# Patient Record
Sex: Female | Born: 1980 | Race: White | Hispanic: No | Marital: Single | Smoking: Never smoker
Health system: Southern US, Community
[De-identification: ages and names within clinical notes are randomized; demographics above are authoritative.]

## PROBLEM LIST (undated history)

## (undated) DIAGNOSIS — M549 Dorsalgia, unspecified: Secondary | ICD-10-CM

## (undated) HISTORY — DX: Dorsalgia, unspecified: M54.9

---
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
## 2002-04-04 ENCOUNTER — Other Ambulatory Visit: Admission: RE | Admit: 2002-04-04 | Discharge: 2002-04-04 | Payer: Self-pay | Admitting: Obstetrics & Gynecology

## 2002-07-01 ENCOUNTER — Emergency Department (HOSPITAL_COMMUNITY): Admission: EM | Admit: 2002-07-01 | Discharge: 2002-07-01 | Payer: Self-pay | Admitting: Emergency Medicine

## 2002-11-11 ENCOUNTER — Inpatient Hospital Stay (HOSPITAL_COMMUNITY): Admission: AD | Admit: 2002-11-11 | Discharge: 2002-11-11 | Payer: Self-pay | Admitting: Obstetrics & Gynecology

## 2002-11-12 ENCOUNTER — Inpatient Hospital Stay (HOSPITAL_COMMUNITY): Admission: AD | Admit: 2002-11-12 | Discharge: 2002-11-13 | Payer: Self-pay | Admitting: Obstetrics & Gynecology

## 2003-01-29 ENCOUNTER — Emergency Department (HOSPITAL_COMMUNITY): Admission: EM | Admit: 2003-01-29 | Discharge: 2003-01-29 | Payer: Self-pay | Admitting: Emergency Medicine

## 2003-08-15 ENCOUNTER — Other Ambulatory Visit: Admission: RE | Admit: 2003-08-15 | Discharge: 2003-08-15 | Payer: Self-pay | Admitting: Obstetrics and Gynecology

## 2003-11-03 ENCOUNTER — Emergency Department (HOSPITAL_COMMUNITY): Admission: EM | Admit: 2003-11-03 | Discharge: 2003-11-03 | Payer: Self-pay | Admitting: Family Medicine

## 2004-08-17 ENCOUNTER — Other Ambulatory Visit: Admission: RE | Admit: 2004-08-17 | Discharge: 2004-08-17 | Payer: Self-pay | Admitting: Obstetrics and Gynecology

## 2013-07-17 ENCOUNTER — Other Ambulatory Visit: Payer: Self-pay | Admitting: Obstetrics and Gynecology

## 2013-07-17 DIAGNOSIS — N63 Unspecified lump in unspecified breast: Secondary | ICD-10-CM

## 2013-07-25 ENCOUNTER — Inpatient Hospital Stay: Admission: RE | Admit: 2013-07-25 | Payer: Self-pay | Source: Ambulatory Visit

## 2013-07-25 ENCOUNTER — Other Ambulatory Visit: Payer: Self-pay

## 2013-08-08 ENCOUNTER — Other Ambulatory Visit: Payer: Self-pay

## 2013-08-14 ENCOUNTER — Other Ambulatory Visit: Payer: Self-pay

## 2015-05-09 ENCOUNTER — Ambulatory Visit (INDEPENDENT_AMBULATORY_CARE_PROVIDER_SITE_OTHER): Payer: BLUE CROSS/BLUE SHIELD | Admitting: Family Medicine

## 2015-05-09 VITALS — BP 108/68 | HR 81 | Temp 98.0°F | Resp 20 | Ht 68.0 in | Wt 221.6 lb

## 2015-05-09 DIAGNOSIS — J069 Acute upper respiratory infection, unspecified: Secondary | ICD-10-CM

## 2015-05-09 NOTE — Progress Notes (Signed)
Subjective:  By signing my name below, I, Ashlee Ware, attest that this documentation has been prepared under the direction and in the presence of [name], MD.  Electronically Signed: Andrew Au, ED Scribe. 05/09/2015. 1:05 PM.   Patient ID: Ashlee Ware, female    DOB: 09/19/1980, 35 y.o.   MRN: [id number]  HPI Chief Complaint  Patient presents with  . Cough    x 5 days  . Sinus Problem  . Sore Throat  . Chest Pain   HPI Comments: Ashlee Ware is a 35 y.o. female who presents to the [facility] complaining of cough for 5 days. Pt states symptoms started with fever of 101, 4 days and cough with a bad taste in her mouth. She reports fevers subsided but developed worsening cough, chest soreness due to cough, body aches, HA, loose stools. Sore throat, ear pressure and watery eyes yesterday. She has taken tylenol cold and flu and mucinex with minimal relief. She denies nausea and emesis.   There are no active problems to display for this patient.  History reviewed. No pertinent past medical history. History reviewed. No pertinent past surgical history. No Known Allergies Prior to Admission medications   Not on File   Social History   Social History  . Marital Status: Single    Spouse Name: N/A  . Number of Children: N/A  . Years of Education: N/A   Occupational History  . Not on file.   Social History Main Topics  . Smoking status: Never Smoker   . Smokeless tobacco: Not on file  . Alcohol Use: No  . Drug Use: No  . Sexual Activity: Not on file   Other Topics Concern  . Not on file   Social History Narrative  . No narrative on file   Review of Systems  Constitutional: Positive for fever and chills.  HENT: Positive for congestion, sinus pressure and sore throat.   Eyes: Positive for discharge ( watery).  Respiratory: Positive for cough.   Gastrointestinal: Positive for diarrhea. Negative for nausea, vomiting and abdominal pain.    Musculoskeletal: Positive for myalgias.  Neurological: Positive for headaches.   Objective:   Physical Exam  Constitutional: She is oriented to person, place, and time. She appears well-developed and well-nourished. No distress.  HENT:  Head: Normocephalic and atraumatic.  Nose: Right sinus exhibits no maxillary sinus tenderness and no frontal sinus tenderness. Left sinus exhibits no maxillary sinus tenderness and no frontal sinus tenderness.  Mouth/Throat: Oropharynx is clear and moist. No posterior oropharyngeal erythema.  Eyes: Conjunctivae and EOM are normal.  Neck: Neck supple.  Cardiovascular: Normal rate.   Pulmonary/Chest: Effort normal.  Musculoskeletal: Normal range of motion.  Lymphadenopathy:    She has no cervical adenopathy.  Neurological: She is alert and oriented to person, place, and time.  Skin: Skin is warm and dry.  Psychiatric: She has a normal mood and affect. Her behavior is normal.  Nursing note and vitals reviewed.  Filed Vitals:   05/09/15 1224  BP: 108/68  Pulse: 81  Temp: 98 F (36.7 C)  TempSrc: Oral  Resp: 20  Height:  (1.727 m)  Weight: 221 lb 9.6 oz (100.517 kg)  SpO2: 98%   Assessment & Plan:  [name] is a 35 y.o. female Acute upper respiratory infection  - Suspected viral syndrome earlier this week, and by initial description may have been influenza. She has now improved and afebrile now.  -Symptomatic  care discussed, as in AVS. RTC precautions discussed.   No orders of the defined types were placed in this encounter.   Patient Instructions  I suspect you had the flu or another virus, that appears to be improving. If fevers return, or you are worsening, recommend recheck. For now can use salt water saline nasal spray over-the-counter for congestion, Sudafed if needed for pressure or congestion, Mucinex or Mucinex DM as needed for cough, and drink plenty of fluids throughout the day. Rest as needed.   Return to the  clinic or go to the nearest emergency room if any of your symptoms worsen or new symptoms occur.  Upper Respiratory Infection, Adult Most upper respiratory infections (URIs) are a viral infection of the air passages leading to the lungs. A URI affects the nose, throat, and upper air passages. The most common type of URI is nasopharyngitis and is typically referred to as "the common cold." URIs run their course and usually go away on their own. Most of the time, a URI does not require medical attention, but sometimes a bacterial infection in the upper airways can follow a viral infection. This is called a secondary infection. Sinus and middle ear infections are common types of secondary upper respiratory infections. Bacterial pneumonia can also complicate a URI. A URI can worsen asthma and chronic obstructive pulmonary disease (COPD). Sometimes, these complications can require emergency medical care and may be life threatening.  CAUSES Almost all URIs are caused by viruses. A virus is a type of germ and can spread from one person to another.  RISKS FACTORS You may be at risk for a URI if:   You smoke.   You have chronic heart or lung disease.  You have a weakened defense (immune) system.   You are very young or very old.   You have nasal allergies or asthma.  You work in crowded or poorly ventilated areas.  You work in health care facilities or schools. SIGNS AND SYMPTOMS  Symptoms typically develop 2-3 days after you come in contact with a cold virus. Most viral URIs last 7-10 days. However, viral URIs from the influenza virus (flu virus) can last 14-18 days and are typically more severe. Symptoms may include:   Runny or stuffy (congested) nose.   Sneezing.   Cough.   Sore throat.   Headache.   Fatigue.   Fever.   Loss of appetite.   Pain in your forehead, behind your eyes, and over your cheekbones (sinus pain).  Muscle aches.  DIAGNOSIS  Your health care  provider may diagnose a URI by:  Physical exam.  Tests to check that your symptoms are not due to another condition such as:  Strep throat.  Sinusitis.  Pneumonia.  Asthma. TREATMENT  A URI goes away on its own with time. It cannot be cured with medicines, but medicines may be prescribed or recommended to relieve symptoms. Medicines may help:  Reduce your fever.  Reduce your cough.  Relieve nasal congestion. HOME CARE INSTRUCTIONS   Take medicines only as directed by your health care provider.   Gargle warm saltwater or take cough drops to comfort your throat as directed by your health care provider.  Use a warm mist humidifier or inhale steam from a shower to increase air moisture. This may make it easier to breathe.  Drink enough fluid to keep your urine clear or pale yellow.   Eat soups and other clear broths and maintain good nutrition.   Rest as  needed.   Return to work when your temperature has returned to normal or as your health care provider advises. You may need to stay home longer to avoid infecting others. You can also use a face mask and careful hand washing to prevent spread of the virus.  Increase the usage of your inhaler if you have asthma.   Do not use any tobacco products, including cigarettes, chewing tobacco, or electronic cigarettes. If you need help quitting, ask your health care provider. PREVENTION  The best way to protect yourself from getting a cold is to practice good hygiene.   Avoid oral or hand contact with people with cold symptoms.   Wash your hands often if contact occurs.  There is no clear evidence that vitamin C, vitamin E, echinacea, or exercise reduces the chance of developing a cold. However, it is always recommended to get plenty of rest, exercise, and practice good nutrition.  SEEK MEDICAL CARE IF:   You are getting worse rather than better.   Your symptoms are not controlled by medicine.   You have chills.  You  have worsening shortness of breath.  You have brown or red mucus.  You have yellow or brown nasal discharge.  You have pain in your face, especially when you bend forward.  You have a fever.  You have swollen neck glands.  You have pain while swallowing.  You have white areas in the back of your throat. SEEK IMMEDIATE MEDICAL CARE IF:   You have severe or persistent:  Headache.  Ear pain.  Sinus pain.  Chest pain.  You have chronic lung disease and any of the following:  Wheezing.  Prolonged cough.  Coughing up blood.  A change in your usual mucus.  You have a stiff neck.  You have changes in your:  Vision.  Hearing.  Thinking.  Mood. MAKE SURE YOU:   Understand these instructions.  Will watch your condition.  Will get help right away if you are not doing well or get worse.   This information is not intended to replace advice given to you by your health care provider. Make sure you discuss any questions you have with your health care provider.   Document Released: 09/29/2000 Document Revised: 08/20/2014 Document Reviewed: 07/11/2013 Elsevier Interactive Patient Education [employer]     I personally performed the services described in this documentation, which was scribed in my presence. The recorded information has been reviewed and considered, and addended by me as needed.

## 2015-05-09 NOTE — Patient Instructions (Signed)
I suspect you had the flu or another virus, that appears to be improving. If fevers return, or you are worsening, recommend recheck. For now can use salt water saline nasal spray over-the-counter for congestion, Sudafed if needed for pressure or congestion, Mucinex or Mucinex DM as needed for cough, and drink plenty of fluids throughout the day. Rest as needed.   Return to the clinic or go to the nearest emergency room if any of your symptoms worsen or new symptoms occur.  Upper Respiratory Infection, Adult Most upper respiratory infections (URIs) are a viral infection of the air passages leading to the lungs. A URI affects the nose, throat, and upper air passages. The most common type of URI is nasopharyngitis and is typically referred to as "the common cold." URIs run their course and usually go away on their own. Most of the time, a URI does not require medical attention, but sometimes a bacterial infection in the upper airways can follow a viral infection. This is called a secondary infection. Sinus and middle ear infections are common types of secondary upper respiratory infections. Bacterial pneumonia can also complicate a URI. A URI can worsen asthma and chronic obstructive pulmonary disease (COPD). Sometimes, these complications can require emergency medical care and may be life threatening.  CAUSES Almost all URIs are caused by viruses. A virus is a type of germ and can spread from one person to another.  RISKS FACTORS You may be at risk for a URI if:   You smoke.   You have chronic heart or lung disease.  You have a weakened defense (immune) system.   You are very young or very old.   You have nasal allergies or asthma.  You work in crowded or poorly ventilated areas.  You work in health care facilities or schools. SIGNS AND SYMPTOMS  Symptoms typically develop 2-3 days after you come in contact with a cold virus. Most viral URIs last 7-10 days. However, viral URIs from the  influenza virus (flu virus) can last 14-18 days and are typically more severe. Symptoms may include:   Runny or stuffy (congested) nose.   Sneezing.   Cough.   Sore throat.   Headache.   Fatigue.   Fever.   Loss of appetite.   Pain in your forehead, behind your eyes, and over your cheekbones (sinus pain).  Muscle aches.  DIAGNOSIS  Your health care provider may diagnose a URI by:  Physical exam.  Tests to check that your symptoms are not due to another condition such as:  Strep throat.  Sinusitis.  Pneumonia.  Asthma. TREATMENT  A URI goes away on its own with time. It cannot be cured with medicines, but medicines may be prescribed or recommended to relieve symptoms. Medicines may help:  Reduce your fever.  Reduce your cough.  Relieve nasal congestion. HOME CARE INSTRUCTIONS   Take medicines only as directed by your health care provider.   Gargle warm saltwater or take cough drops to comfort your throat as directed by your health care provider.  Use a warm mist humidifier or inhale steam from a shower to increase air moisture. This may make it easier to breathe.  Drink enough fluid to keep your urine clear or pale yellow.   Eat soups and other clear broths and maintain good nutrition.   Rest as needed.   Return to work when your temperature has returned to normal or as your health care provider advises. You may need to stay home longer  to avoid infecting others. You can also use a face mask and careful hand washing to prevent spread of the virus.  Increase the usage of your inhaler if you have asthma.   Do not use any tobacco products, including cigarettes, chewing tobacco, or electronic cigarettes. If you need help quitting, ask your health care provider. PREVENTION  The best way to protect yourself from getting a cold is to practice good hygiene.   Avoid oral or hand contact with people with cold symptoms.   Wash your hands often if  contact occurs.  There is no clear evidence that vitamin C, vitamin E, echinacea, or exercise reduces the chance of developing a cold. However, it is always recommended to get plenty of rest, exercise, and practice good nutrition.  SEEK MEDICAL CARE IF:   You are getting worse rather than better.   Your symptoms are not controlled by medicine.   You have chills.  You have worsening shortness of breath.  You have brown or red mucus.  You have yellow or brown nasal discharge.  You have pain in your face, especially when you bend forward.  You have a fever.  You have swollen neck glands.  You have pain while swallowing.  You have white areas in the back of your throat. SEEK IMMEDIATE MEDICAL CARE IF:   You have severe or persistent:  Headache.  Ear pain.  Sinus pain.  Chest pain.  You have chronic lung disease and any of the following:  Wheezing.  Prolonged cough.  Coughing up blood.  A change in your usual mucus.  You have a stiff neck.  You have changes in your:  Vision.  Hearing.  Thinking.  Mood. MAKE SURE YOU:   Understand these instructions.  Will watch your condition.  Will get help right away if you are not doing well or get worse.   This information is not intended to replace advice given to you by your health care provider. Make sure you discuss any questions you have with your health care provider.   Document Released: 09/29/2000 Document Revised: 08/20/2014 Document Reviewed: 07/11/2013 Elsevier Interactive Patient Education [employer]

## 2017-07-21 ENCOUNTER — Other Ambulatory Visit: Payer: Self-pay | Admitting: Obstetrics & Gynecology

## 2017-07-21 DIAGNOSIS — N6320 Unspecified lump in the left breast, unspecified quadrant: Secondary | ICD-10-CM

## 2017-07-26 ENCOUNTER — Inpatient Hospital Stay: Admission: RE | Admit: 2017-07-26 | Payer: Self-pay | Source: Ambulatory Visit

## 2018-12-27 ENCOUNTER — Ambulatory Visit (INDEPENDENT_AMBULATORY_CARE_PROVIDER_SITE_OTHER): Payer: BC Managed Care – PPO | Admitting: Licensed Clinical Social Worker

## 2018-12-27 DIAGNOSIS — F321 Major depressive disorder, single episode, moderate: Secondary | ICD-10-CM

## 2019-01-09 ENCOUNTER — Ambulatory Visit: Payer: BC Managed Care – PPO | Admitting: Licensed Clinical Social Worker

## 2019-01-20 ENCOUNTER — Emergency Department (HOSPITAL_COMMUNITY): Admission: EM | Admit: 2019-01-20 | Discharge: 2019-01-20 | Discharge status: Absent without leave | Payer: Self-pay

## 2019-04-27 ENCOUNTER — Other Ambulatory Visit: Payer: Self-pay | Admitting: Obstetrics & Gynecology

## 2019-04-27 DIAGNOSIS — N6310 Unspecified lump in the right breast, unspecified quadrant: Secondary | ICD-10-CM

## 2019-05-11 ENCOUNTER — Other Ambulatory Visit: Payer: Self-pay | Admitting: Obstetrics & Gynecology

## 2019-05-11 ENCOUNTER — Other Ambulatory Visit: Payer: Self-pay

## 2019-05-11 ENCOUNTER — Ambulatory Visit
Admission: RE | Admit: 2019-05-11 | Discharge: 2019-05-11 | Disposition: A | Discharge status: Home / Self Care | Payer: BC Managed Care – PPO | Source: Ambulatory Visit | Attending: Obstetrics & Gynecology | Admitting: Obstetrics & Gynecology

## 2019-05-11 DIAGNOSIS — N6310 Unspecified lump in the right breast, unspecified quadrant: Secondary | ICD-10-CM

## 2019-07-19 DIAGNOSIS — G5603 Carpal tunnel syndrome, bilateral upper limbs: Secondary | ICD-10-CM | POA: Diagnosis not present

## 2019-11-09 ENCOUNTER — Other Ambulatory Visit: Payer: BC Managed Care – PPO

## 2019-11-22 ENCOUNTER — Other Ambulatory Visit: Payer: Self-pay

## 2019-11-22 DIAGNOSIS — M5416 Radiculopathy, lumbar region: Secondary | ICD-10-CM | POA: Diagnosis not present

## 2019-11-22 DIAGNOSIS — M9903 Segmental and somatic dysfunction of lumbar region: Secondary | ICD-10-CM | POA: Diagnosis not present

## 2019-11-22 DIAGNOSIS — M609 Myositis, unspecified: Secondary | ICD-10-CM | POA: Diagnosis not present

## 2019-11-22 DIAGNOSIS — M5137 Other intervertebral disc degeneration, lumbosacral region: Secondary | ICD-10-CM | POA: Diagnosis not present

## 2019-11-27 DIAGNOSIS — M609 Myositis, unspecified: Secondary | ICD-10-CM | POA: Diagnosis not present

## 2019-11-27 DIAGNOSIS — M5137 Other intervertebral disc degeneration, lumbosacral region: Secondary | ICD-10-CM | POA: Diagnosis not present

## 2019-11-27 DIAGNOSIS — M5416 Radiculopathy, lumbar region: Secondary | ICD-10-CM | POA: Diagnosis not present

## 2019-11-27 DIAGNOSIS — M9903 Segmental and somatic dysfunction of lumbar region: Secondary | ICD-10-CM | POA: Diagnosis not present

## 2019-11-28 DIAGNOSIS — N760 Acute vaginitis: Secondary | ICD-10-CM | POA: Diagnosis not present

## 2019-11-28 DIAGNOSIS — R635 Abnormal weight gain: Secondary | ICD-10-CM | POA: Diagnosis not present

## 2019-11-29 DIAGNOSIS — M5416 Radiculopathy, lumbar region: Secondary | ICD-10-CM | POA: Diagnosis not present

## 2019-11-29 DIAGNOSIS — M5137 Other intervertebral disc degeneration, lumbosacral region: Secondary | ICD-10-CM | POA: Diagnosis not present

## 2019-11-29 DIAGNOSIS — M9903 Segmental and somatic dysfunction of lumbar region: Secondary | ICD-10-CM | POA: Diagnosis not present

## 2019-11-29 DIAGNOSIS — M609 Myositis, unspecified: Secondary | ICD-10-CM | POA: Diagnosis not present

## 2019-12-06 DIAGNOSIS — M5137 Other intervertebral disc degeneration, lumbosacral region: Secondary | ICD-10-CM | POA: Diagnosis not present

## 2019-12-06 DIAGNOSIS — M5416 Radiculopathy, lumbar region: Secondary | ICD-10-CM | POA: Diagnosis not present

## 2019-12-06 DIAGNOSIS — M609 Myositis, unspecified: Secondary | ICD-10-CM | POA: Diagnosis not present

## 2019-12-06 DIAGNOSIS — M9903 Segmental and somatic dysfunction of lumbar region: Secondary | ICD-10-CM | POA: Diagnosis not present

## 2019-12-12 DIAGNOSIS — M9903 Segmental and somatic dysfunction of lumbar region: Secondary | ICD-10-CM | POA: Diagnosis not present

## 2019-12-12 DIAGNOSIS — M609 Myositis, unspecified: Secondary | ICD-10-CM | POA: Diagnosis not present

## 2019-12-12 DIAGNOSIS — M5137 Other intervertebral disc degeneration, lumbosacral region: Secondary | ICD-10-CM | POA: Diagnosis not present

## 2019-12-12 DIAGNOSIS — M5416 Radiculopathy, lumbar region: Secondary | ICD-10-CM | POA: Diagnosis not present

## 2020-01-02 DIAGNOSIS — Z03818 Encounter for observation for suspected exposure to other biological agents ruled out: Secondary | ICD-10-CM | POA: Diagnosis not present

## 2020-01-02 DIAGNOSIS — Z20822 Contact with and (suspected) exposure to covid-19: Secondary | ICD-10-CM | POA: Diagnosis not present

## 2020-01-22 DIAGNOSIS — R6883 Chills (without fever): Secondary | ICD-10-CM | POA: Diagnosis not present

## 2020-01-22 DIAGNOSIS — J351 Hypertrophy of tonsils: Secondary | ICD-10-CM | POA: Diagnosis not present

## 2020-01-22 DIAGNOSIS — R519 Headache, unspecified: Secondary | ICD-10-CM | POA: Diagnosis not present

## 2020-01-22 DIAGNOSIS — J029 Acute pharyngitis, unspecified: Secondary | ICD-10-CM | POA: Diagnosis not present

## 2020-03-27 DIAGNOSIS — R635 Abnormal weight gain: Secondary | ICD-10-CM | POA: Diagnosis not present

## 2020-03-27 DIAGNOSIS — N951 Menopausal and female climacteric states: Secondary | ICD-10-CM | POA: Diagnosis not present

## 2020-06-04 ENCOUNTER — Other Ambulatory Visit: Payer: Self-pay

## 2020-08-06 ENCOUNTER — Ambulatory Visit (INDEPENDENT_AMBULATORY_CARE_PROVIDER_SITE_OTHER): Payer: BC Managed Care – PPO | Admitting: Bariatrics

## 2020-08-20 ENCOUNTER — Ambulatory Visit (INDEPENDENT_AMBULATORY_CARE_PROVIDER_SITE_OTHER): Payer: Self-pay | Admitting: Bariatrics

## 2020-09-09 ENCOUNTER — Ambulatory Visit (INDEPENDENT_AMBULATORY_CARE_PROVIDER_SITE_OTHER): Payer: BC Managed Care – PPO | Admitting: Bariatrics

## 2020-09-09 ENCOUNTER — Other Ambulatory Visit: Payer: Self-pay

## 2020-09-09 ENCOUNTER — Encounter (INDEPENDENT_AMBULATORY_CARE_PROVIDER_SITE_OTHER): Payer: Self-pay | Admitting: Bariatrics

## 2020-09-09 VITALS — BP 123/79 | HR 68 | Temp 98.1°F | Ht 68.0 in | Wt 253.0 lb

## 2020-09-09 DIAGNOSIS — Z9189 Other specified personal risk factors, not elsewhere classified: Secondary | ICD-10-CM

## 2020-09-09 DIAGNOSIS — Z91018 Allergy to other foods: Secondary | ICD-10-CM | POA: Diagnosis not present

## 2020-09-09 DIAGNOSIS — G8929 Other chronic pain: Secondary | ICD-10-CM

## 2020-09-09 DIAGNOSIS — R5383 Other fatigue: Secondary | ICD-10-CM | POA: Diagnosis not present

## 2020-09-09 DIAGNOSIS — R0602 Shortness of breath: Secondary | ICD-10-CM | POA: Diagnosis not present

## 2020-09-09 DIAGNOSIS — Z6841 Body Mass Index (BMI) 40.0 and over, adult: Secondary | ICD-10-CM

## 2020-09-09 DIAGNOSIS — M5489 Other dorsalgia: Secondary | ICD-10-CM | POA: Diagnosis not present

## 2020-09-09 DIAGNOSIS — F3289 Other specified depressive episodes: Secondary | ICD-10-CM | POA: Diagnosis not present

## 2020-09-09 DIAGNOSIS — R7309 Other abnormal glucose: Secondary | ICD-10-CM

## 2020-09-09 DIAGNOSIS — E559 Vitamin D deficiency, unspecified: Secondary | ICD-10-CM | POA: Diagnosis not present

## 2020-09-09 DIAGNOSIS — E538 Deficiency of other specified B group vitamins: Secondary | ICD-10-CM

## 2020-09-09 DIAGNOSIS — Z0289 Encounter for other administrative examinations: Secondary | ICD-10-CM

## 2020-09-09 DIAGNOSIS — Z1331 Encounter for screening for depression: Secondary | ICD-10-CM

## 2020-09-10 ENCOUNTER — Encounter (INDEPENDENT_AMBULATORY_CARE_PROVIDER_SITE_OTHER): Payer: Self-pay | Admitting: Bariatrics

## 2020-09-10 DIAGNOSIS — E559 Vitamin D deficiency, unspecified: Secondary | ICD-10-CM | POA: Insufficient documentation

## 2020-09-10 DIAGNOSIS — E669 Obesity, unspecified: Secondary | ICD-10-CM | POA: Insufficient documentation

## 2020-09-10 LAB — COMPREHENSIVE METABOLIC PANEL
ALT: 12 IU/L (ref 0–32)
AST: 12 IU/L (ref 0–40)
Albumin/Globulin Ratio: 1.7 (ref 1.2–2.2)
Albumin: 4.4 g/dL (ref 3.8–4.8)
Alkaline Phosphatase: 60 IU/L (ref 44–121)
BUN/Creatinine Ratio: 18 (ref 9–23)
BUN: 14 mg/dL (ref 6–20)
Bilirubin Total: 0.4 mg/dL (ref 0.0–1.2)
CO2: 24 mmol/L (ref 20–29)
Calcium: 9.4 mg/dL (ref 8.7–10.2)
Chloride: 101 mmol/L (ref 96–106)
Creatinine, Ser: 0.8 mg/dL (ref 0.57–1.00)
Globulin, Total: 2.6 g/dL (ref 1.5–4.5)
Glucose: 88 mg/dL (ref 65–99)
Potassium: 4.6 mmol/L (ref 3.5–5.2)
Sodium: 139 mmol/L (ref 134–144)
Total Protein: 7 g/dL (ref 6.0–8.5)
eGFR: 97 mL/min/{1.73_m2} (ref >59)

## 2020-09-10 LAB — T4, FREE: Free T4: 1.14 ng/dL (ref 0.82–1.77)

## 2020-09-10 LAB — HEMOGLOBIN A1C
Est. average glucose Bld gHb Est-mCnc: 111 mg/dL
Hgb A1c MFr Bld: 5.5 % (ref 4.8–5.6)

## 2020-09-10 LAB — CBC WITH DIFFERENTIAL/PLATELET
Basophils Absolute: 0.1 10*3/uL (ref 0.0–0.2)
Basos: 1 %
EOS (ABSOLUTE): 0.3 10*3/uL (ref 0.0–0.4)
Eos: 5 %
Hematocrit: 41.4 % (ref 34.0–46.6)
Hemoglobin: 13.6 g/dL (ref 11.1–15.9)
Immature Grans (Abs): 0 10*3/uL (ref 0.0–0.1)
Immature Granulocytes: 0 %
Lymphocytes Absolute: 2 10*3/uL (ref 0.7–3.1)
Lymphs: 30 %
MCH: 30 pg (ref 26.6–33.0)
MCHC: 32.9 g/dL (ref 31.5–35.7)
MCV: 91 fL (ref 79–97)
Monocytes Absolute: 0.4 10*3/uL (ref 0.1–0.9)
Monocytes: 6 %
Neutrophils Absolute: 3.8 10*3/uL (ref 1.4–7.0)
Neutrophils: 58 %
Platelets: 231 10*3/uL (ref 150–450)
RBC: 4.54 x10E6/uL (ref 3.77–5.28)
RDW: 12.6 % (ref 11.7–15.4)
WBC: 6.6 10*3/uL (ref 3.4–10.8)

## 2020-09-10 LAB — INSULIN, RANDOM: INSULIN: 7.7 u[IU]/mL (ref 2.6–24.9)

## 2020-09-10 LAB — LIPID PANEL WITH LDL/HDL RATIO
Cholesterol, Total: 201 mg/dL — ABNORMAL HIGH (ref 100–199)
HDL: 44 mg/dL (ref >39)
LDL Chol Calc (NIH): 133 mg/dL — ABNORMAL HIGH (ref 0–99)
LDL/HDL Ratio: 3 ratio (ref 0.0–3.2)
Triglycerides: 131 mg/dL (ref 0–149)
VLDL Cholesterol Cal: 24 mg/dL (ref 5–40)

## 2020-09-10 LAB — TSH: TSH: 1.56 u[IU]/mL (ref 0.450–4.500)

## 2020-09-10 LAB — VITAMIN D 25 HYDROXY (VIT D DEFICIENCY, FRACTURES): Vit D, 25-Hydroxy: 24 ng/mL — ABNORMAL LOW (ref 30.0–100.0)

## 2020-09-10 LAB — VITAMIN B12: Vitamin B-12: 408 pg/mL (ref 232–1245)

## 2020-09-10 LAB — T3: T3, Total: 119 ng/dL (ref 71–180)

## 2020-09-10 NOTE — Telephone Encounter (Signed)
Please review

## 2020-09-10 NOTE — Progress Notes (Signed)
Chief Complaint:   Ashlee Ware (MR# [id number]) is a 40 y.o. female who presents for evaluation and treatment of Ashlee and related comorbidities. Current BMI is Body mass index is 38.47 kg/m. Ashlee Ware has been struggling with her weight for many years and has been unsuccessful in either losing weight, maintaining weight loss, or reaching her healthy weight goal.  Ashlee Ware does like to cook at times. She states that she craves coffee, and she eats out a lot.  Ashlee Ware is currently in the action stage of change and ready to dedicate time achieving and maintaining a healthier weight. Ashlee Ware is interested in becoming our patient and working on intensive lifestyle modifications including (but not limited to) diet and exercise for weight loss.  Ashlee Ware's habits were reviewed today and are as follows: Her family eats meals together, she thinks her family will eat healthier with her, her desired weight loss is 73 lbs, her heaviest weight ever was 253 pounds, she is frequently drinking liquids with calories, she frequently makes poor food choices, she frequently eats larger portions than normal and she struggles with emotional eating.  Depression Screen Ashlee Ware's Food and Mood (modified PHQ-9) score was 20.  Depression screen [facility] 2/9 09/09/2020  Decreased Interest 2  Down, Depressed, Hopeless 2  PHQ - 2 Score 4  Altered sleeping 3  Tired, decreased energy 3  Change in appetite 2  Feeling bad or failure about yourself  2  Trouble concentrating 2  Moving slowly or fidgety/restless 2  Suicidal thoughts 2  PHQ-9 Score 20  Difficult doing work/chores Very difficult   Subjective:   1. Other fatigue Ashlee Ware admits to daytime somnolence and admits to waking up still tired. Patent has a history of symptoms of daytime fatigue and morning headache. Ashlee Ware generally gets 8 hours of sleep per night, and states that she has generally restful sleep. Snoring is present. Apneic episodes  are not present. Epworth Sleepiness Score is 16.  2. SOB (shortness of breath) on exertion Ashlee Ware Standard notes increasing shortness of breath with exercising and seems to be worsening over time with weight gain. She notes getting out of breath sooner with activity than she used to. This has not gotten worse recently. Ashlee Ware denies shortness of breath at rest or orthopnea.  3. Other back pain, unspecified chronicity Ashlee Ware has a history of bilateral chronic back pain.  4. Multiple food allergies Ashlee Ware has multiple food allergies, and she needs testing.  5. Vitamin D deficiency Ashlee Ware is not on vitamins currently.  6. Vitamin B 12 deficiency Ashlee Ware is not on vitamins currently.  7. Other depression, with emotional eating Ashlee Ware notes more cravings for sweets but is unsure if it is emotional eating. Her PHQ-9 score is 20.  8. At risk for activity intolerance Ashlee Ware is at risk for exercise intolerance due to fatigue and shortness of breath.  Assessment/Plan:   1. Other fatigue Ashlee Ware does feel that her weight is causing her energy to be lower than it should be. Fatigue may be related to Ashlee, depression or many other causes. Labs will be ordered, and in the meanwhile, Ashlee Ware will focus on self care including making healthy food choices, increasing physical activity and focusing on stress reduction.  - EKG 12-Lead - Vitamin B12 - CBC with Differential/Platelet - Comprehensive metabolic panel - Hemoglobin A1c - Insulin, random - Lipid Panel With LDL/HDL Ratio - T3 - T4, free - TSH - VITAMIN D 25 Hydroxy (Vit-D Deficiency, Fractures)  2. SOB (shortness of  breath) on exertion Ashlee Ware does feel that she gets out of breath more easily that she used to when she exercises. Ashlee Ware's shortness of breath appears to be Ashlee related and exercise induced. She has agreed to work on weight loss and gradually increase exercise to treat her exercise induced shortness of breath. Will  continue to monitor closely.  3. Other back pain, unspecified chronicity Ashlee Ware is to gradually increase exercise and will consider stretches.  4. Multiple food allergies We will refer Ashlee Ware to Allergy for evaluation.  - Ambulatory referral to Allergy  5. Vitamin D deficiency Low Vitamin D level contributes to fatigue and are associated with Ashlee, breast, and colon cancer. We will check labs today. Ashlee Ware will follow-up for routine testing of Vitamin D, at least 2-3 times per year to avoid over-replacement.  - VITAMIN D 25 Hydroxy (Vit-D Deficiency, Fractures)  6. Vitamin B 12 deficiency The diagnosis was reviewed with the patient. Counseling provided to [facility] today, see below. We will check labs today, and will continue to monitor. Orders and follow up as documented in patient record.  Counseling . The body needs vitamin B12: to make red blood cells; to make DNA; and to help the nerves work properly so they can carry messages from the brain to the body.  . The main causes of vitamin B12 deficiency include dietary deficiency, digestive diseases, pernicious anemia, and having a surgery in which part of the stomach or small intestine is removed.  . Certain medicines can make it harder for the body to absorb vitamin B12. These medicines include: heartburn medications; some antibiotics; some medications used to treat diabetes, gout, and high cholesterol.  . In some cases, there are no symptoms of this condition. If the condition leads to anemia or nerve damage, various symptoms can occur, such as weakness or fatigue, shortness of breath, and numbness or tingling in your hands and feet.   . Treatment:  o May include taking vitamin B12 supplements.  o Avoid alcohol.  o Eat lots of healthy foods that contain vitamin B12: - Beef, pork, chicken, [place], and organ meats, such as liver.  - Seafood: This includes clams, rainbow trout, salmon, tuna, and haddock. Eggs.  - Cereal and dairy  products that are fortified: This means that vitamin B12 has been added to the food.   - Vitamin B12  7. Other depression, with emotional eating Behavior modification techniques were discussed today to help Shenell deal with her emotional/non-hunger eating behaviors. Handout for emotional eating was given today. Orders and follow up as documented in patient record.   8. At risk for activity intolerance Dianna was given approximately 15 minutes of exercise intolerance counseling today. She is 40 y.o. female and has risk factors exercise intolerance including Ashlee. We discussed intensive lifestyle modifications today with an emphasis on specific weight loss instructions and strategies. Sayra will slowly increase activity as tolerated.  Repetitive spaced learning was employed today to elicit superior memory formation and behavioral change.  9. 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 I recommend Masey begin the structured treatment plan as follows:  She has agreed to the Category 2 Plan.  Intentional eating was discussed. Marji is to stop all sugary drinks.  Exercise goals: No exercise has been prescribed at this time.   Behavioral modification  strategies: increasing lean protein intake, decreasing simple carbohydrates, increasing vegetables, increasing water intake, decreasing eating out, no skipping meals, meal planning and cooking strategies, keeping healthy foods in the home and planning for success.  She was informed of the importance of frequent follow-up visits to maximize her success with intensive lifestyle modifications for her multiple health conditions. She was informed we would discuss her lab results at her next visit unless there is a critical issue that needs to be addressed  sooner. Hannie agreed to keep her next visit at the agreed upon time to discuss these results.  Objective:   Blood pressure 123/79, pulse 68, temperature 98.1 F (36.7 C), height 5\' 8"  (1.727 m), weight 253 lb (114.8 kg), last menstrual period 08/01/2020, SpO2 99 %. Body mass index is 38.47 kg/m.  EKG: Normal sinus rhythm, rate 63 BPM.  Indirect Calorimeter completed today shows a VO2 of 259 and a REE of 1803.  Her calculated basal metabolic rate is 08/03/2020 thus her basal metabolic rate is worse than expected.  General: Cooperative, alert, well developed, in no acute distress. HEENT: Conjunctivae and lids unremarkable. Cardiovascular: Regular rhythm.  Lungs: Normal work of breathing. Neurologic: No focal deficits.   Lab Results  Component Value Date   CREATININE 0.80 09/09/2020   BUN 14 09/09/2020   NA 139 09/09/2020   K 4.6 09/09/2020   CL 101 09/09/2020   CO2 24 09/09/2020   Lab Results  Component Value Date   ALT 12 09/09/2020   AST 12 09/09/2020   ALKPHOS 60 09/09/2020   BILITOT 0.4 09/09/2020   Lab Results  Component Value Date   HGBA1C 5.5 09/09/2020   Lab Results  Component Value Date   INSULIN 7.7 09/09/2020   Lab Results  Component Value Date   TSH 1.560 09/09/2020   Lab Results  Component Value Date   CHOL 201 (H) 09/09/2020   HDL 44 09/09/2020   LDLCALC 133 (H) 09/09/2020   TRIG 131 09/09/2020   Lab Results  Component Value Date   WBC 6.6 09/09/2020   HGB 13.6 09/09/2020   HCT 41.4 09/09/2020   MCV 91 09/09/2020   PLT 231 09/09/2020   No results found for: IRON, TIBC, FERRITIN  Attestation Statements:   Reviewed by clinician on day of visit: allergies, medications, problem list, medical history, surgical history, family history, social history, and previous encounter notes.   09/11/2020, am acting as Trude Mcburney for Energy manager, DO.  I have reviewed the above documentation for accuracy and completeness, and I agree with the  above. [employer], DO

## 2020-09-10 NOTE — Telephone Encounter (Signed)
Pt last seen by Dr. Brown.  

## 2020-09-14 ENCOUNTER — Encounter (INDEPENDENT_AMBULATORY_CARE_PROVIDER_SITE_OTHER): Payer: Self-pay | Admitting: Bariatrics

## 2020-09-17 DIAGNOSIS — N898 Other specified noninflammatory disorders of vagina: Secondary | ICD-10-CM | POA: Diagnosis not present

## 2020-09-17 DIAGNOSIS — Z01419 Encounter for gynecological examination (general) (routine) without abnormal findings: Secondary | ICD-10-CM | POA: Diagnosis not present

## 2020-09-23 ENCOUNTER — Other Ambulatory Visit (INDEPENDENT_AMBULATORY_CARE_PROVIDER_SITE_OTHER): Payer: Self-pay | Admitting: Bariatrics

## 2020-09-23 ENCOUNTER — Other Ambulatory Visit: Payer: Self-pay

## 2020-09-23 ENCOUNTER — Ambulatory Visit (INDEPENDENT_AMBULATORY_CARE_PROVIDER_SITE_OTHER): Payer: BC Managed Care – PPO | Admitting: Bariatrics

## 2020-09-23 ENCOUNTER — Encounter (INDEPENDENT_AMBULATORY_CARE_PROVIDER_SITE_OTHER): Payer: Self-pay | Admitting: Bariatrics

## 2020-09-23 VITALS — BP 106/71 | HR 90 | Temp 98.7°F | Ht 68.0 in | Wt 251.0 lb

## 2020-09-23 DIAGNOSIS — R632 Polyphagia: Secondary | ICD-10-CM

## 2020-09-23 DIAGNOSIS — E7800 Pure hypercholesterolemia, unspecified: Secondary | ICD-10-CM

## 2020-09-23 DIAGNOSIS — E559 Vitamin D deficiency, unspecified: Secondary | ICD-10-CM | POA: Diagnosis not present

## 2020-09-23 DIAGNOSIS — Z6838 Body mass index (BMI) 38.0-38.9, adult: Secondary | ICD-10-CM

## 2020-09-23 DIAGNOSIS — Z9189 Other specified personal risk factors, not elsewhere classified: Secondary | ICD-10-CM

## 2020-09-23 MED ORDER — VITAMIN D (ERGOCALCIFEROL) 1.25 MG (50000 UNIT) PO CAPS: 50000.0000 [IU] | ORAL_CAPSULE | ORAL | 0 refills | Status: AC

## 2020-09-23 MED ORDER — INSULIN PEN NEEDLE 32G X 4 MM MISC: 0 refills | Status: DC

## 2020-09-23 MED ORDER — SAXENDA 18 MG/3ML ~~LOC~~ SOPN: PEN_INJECTOR | SUBCUTANEOUS | 0 refills | Status: DC

## 2020-09-23 NOTE — Telephone Encounter (Signed)
Dr.Brown 

## 2020-09-24 ENCOUNTER — Other Ambulatory Visit (INDEPENDENT_AMBULATORY_CARE_PROVIDER_SITE_OTHER): Payer: Self-pay | Admitting: Bariatrics

## 2020-09-24 DIAGNOSIS — Z6838 Body mass index (BMI) 38.0-38.9, adult: Secondary | ICD-10-CM

## 2020-09-30 ENCOUNTER — Other Ambulatory Visit (INDEPENDENT_AMBULATORY_CARE_PROVIDER_SITE_OTHER): Payer: Self-pay | Admitting: Bariatrics

## 2020-09-30 ENCOUNTER — Encounter (INDEPENDENT_AMBULATORY_CARE_PROVIDER_SITE_OTHER): Payer: Self-pay | Admitting: Bariatrics

## 2020-09-30 NOTE — Progress Notes (Signed)
Chief Complaint:   OBESITY Spencer is here to discuss her progress with her obesity treatment plan along with follow-up of her obesity related diagnoses. Snigdha is on the Category 2 Plan and states she is following her eating plan approximately 40% of the time. Titiana states she is running and doing HIIT for 45 minutes 2 times per week.  Today's visit was #: 2 Starting weight: 253 lbs Starting date: 09/09/2020 Today's weight: 251 lbs Today's date: 09/23/2020 Total lbs lost to date: 2 Total lbs lost since last in-office visit: 2  Interim History: Kailynn is down 2 lbs since her last visit. She states that she did ok.  Subjective:   1. Vitamin D insufficiency Brayla's recent Vit D level was 24.0. She is not on medications.  2. Nicola Girt notes cravings in the afternoon.  3. Elevated cholesterol Ilanna is not on medications. Her recent LDL is 133 and total cholesterol is 201.  4. At risk for osteoporosis Eron is at higher risk of osteopenia and osteoporosis due to Vitamin D deficiency.   Assessment/Plan:   1. Vitamin D insufficiency Low Vitamin D level contributes to fatigue and are associated with obesity, breast, and colon cancer. Juliza agreed to start prescription Vitamin D 50,000 IU every week with no refills. She will follow-up for routine testing of Vitamin D, at least 2-3 times per year to avoid over-replacement.  - Vitamin D, Ergocalciferol, (DRISDOL) 1.25 MG (50000 UNIT) CAPS capsule; Take 1 capsule (50,000 Units total) by mouth every 7 (seven) days.  Dispense: 4 capsule; Refill: 0  2. Polyphagia Intensive lifestyle modifications are the first line treatment for this issue. We discussed several lifestyle modifications today. Riyanna will start Saxenda, and will continue to work on diet, exercise and weight loss efforts. Orders and follow up as documented in patient record.  Counseling Polyphagia is excessive hunger. Causes can include: low blood  sugars, hypERthyroidism, PMS, lack of sleep, stress, insulin resistance, diabetes, certain medications, and diets that are deficient in protein and fiber.   3. Elevated cholesterol Cardiovascular risk and specific lipid/LDL goals reviewed. We discussed several lifestyle modifications today. Cherita will continue to work on diet, exercise and weight loss efforts. Orders and follow up as documented in patient record.   Counseling Intensive lifestyle modifications are the first line treatment for this issue. Dietary changes: Increase soluble fiber. Decrease simple carbohydrates. Exercise changes: Moderate to vigorous-intensity aerobic activity 150 minutes per week if tolerated. Lipid-lowering medications: see documented in medical record.  4. At risk for osteoporosis Amiyah was given approximately 15 minutes of osteoporosis prevention counseling today. Jeweliana is at risk for osteopenia and osteoporosis due to her Vitamin D deficiency. She was encouraged to take her Vitamin D and follow her higher calcium diet and increase strengthening exercise to help strengthen her bones and decrease her risk of osteopenia and osteoporosis.  Repetitive spaced learning was employed today to elicit superior memory formation and behavioral change.  5. Obesity with current BMI of 38.3 Avanni is currently in the action stage of change. As such, her goal is to continue with weight loss efforts. She has agreed to the Category 2 Plan.   Audrie will be more adherent to the meal plan. I reviewed labs from 09/09/2020 with the patient today.  We discussed various medication options to help Scherrie with her weight loss efforts and we both agreed to start Saxenda 0.6 mg SubQ daily with no refills, and pen needles #50 with no refills.  - Liraglutide -  Weight Management (SAXENDA) 18 MG/3ML SOPN; Will start with 0.6 ml into skin daily and will then taper per physician orders  Dispense: 15 mL; Refill: 0 - Insulin Pen Needle  32G X 4 MM MISC; Use with Saxenda daily  Dispense: 50 each; Refill: 0  Exercise goals: As is.  Behavioral modification strategies: increasing lean protein intake, decreasing simple carbohydrates, increasing vegetables, increasing water intake, decreasing eating out, no skipping meals, meal planning and cooking strategies, keeping healthy foods in the home, and planning for success.  Estela has agreed to follow-up with our clinic in 2 weeks. She was informed of the importance of frequent follow-up visits to maximize her success with intensive lifestyle modifications for her multiple health conditions.   Objective:   Blood pressure 106/71, pulse 90, temperature 98.7 F (37.1 C), height 5\' 8"  (1.727 m), weight 251 lb (113.9 kg), SpO2 98 %. Body mass index is 38.16 kg/m.  General: Cooperative, alert, well developed, in no acute distress. HEENT: Conjunctivae and lids unremarkable. Cardiovascular: Regular rhythm.  Lungs: Normal work of breathing. Neurologic: No focal deficits.   Lab Results  Component Value Date   CREATININE 0.80 09/09/2020   BUN 14 09/09/2020   NA 139 09/09/2020   K 4.6 09/09/2020   CL 101 09/09/2020   CO2 24 09/09/2020   Lab Results  Component Value Date   ALT 12 09/09/2020   AST 12 09/09/2020   ALKPHOS 60 09/09/2020   BILITOT 0.4 09/09/2020   Lab Results  Component Value Date   HGBA1C 5.5 09/09/2020   Lab Results  Component Value Date   INSULIN 7.7 09/09/2020   Lab Results  Component Value Date   TSH 1.560 09/09/2020   Lab Results  Component Value Date   CHOL 201 (H) 09/09/2020   HDL 44 09/09/2020   LDLCALC 133 (H) 09/09/2020   TRIG 131 09/09/2020   Lab Results  Component Value Date   WBC 6.6 09/09/2020   HGB 13.6 09/09/2020   HCT 41.4 09/09/2020   MCV 91 09/09/2020   PLT 231 09/09/2020   No results found for: IRON, TIBC, FERRITIN  Attestation Statements:   Reviewed by clinician on day of visit: allergies, medications, problem list,  medical history, surgical history, family history, social history, and previous encounter notes.   09/11/2020, am acting as Trude Mcburney for Energy manager, DO.  I have reviewed the above documentation for accuracy and completeness, and I agree with the above. - mec

## 2020-10-01 NOTE — Telephone Encounter (Signed)
Last seen by Verdene Rio

## 2020-10-03 DIAGNOSIS — Z3202 Encounter for pregnancy test, result negative: Secondary | ICD-10-CM | POA: Diagnosis not present

## 2020-10-03 DIAGNOSIS — Z30430 Encounter for insertion of intrauterine contraceptive device: Secondary | ICD-10-CM | POA: Diagnosis not present

## 2020-10-08 ENCOUNTER — Other Ambulatory Visit: Payer: Self-pay

## 2020-10-08 ENCOUNTER — Encounter (INDEPENDENT_AMBULATORY_CARE_PROVIDER_SITE_OTHER): Payer: Self-pay | Admitting: Bariatrics

## 2020-10-08 ENCOUNTER — Ambulatory Visit (INDEPENDENT_AMBULATORY_CARE_PROVIDER_SITE_OTHER): Payer: BC Managed Care – PPO | Admitting: Bariatrics

## 2020-10-08 VITALS — BP 104/71 | HR 60 | Temp 98.2°F | Ht 68.0 in | Wt 252.0 lb

## 2020-10-08 DIAGNOSIS — R632 Polyphagia: Secondary | ICD-10-CM

## 2020-10-08 DIAGNOSIS — Z6838 Body mass index (BMI) 38.0-38.9, adult: Secondary | ICD-10-CM | POA: Diagnosis not present

## 2020-10-08 DIAGNOSIS — Z9189 Other specified personal risk factors, not elsewhere classified: Secondary | ICD-10-CM

## 2020-10-08 DIAGNOSIS — E7800 Pure hypercholesterolemia, unspecified: Secondary | ICD-10-CM

## 2020-10-08 MED ORDER — LOMAIRA 8 MG PO TABS: 8.0000 mg | ORAL_TABLET | Freq: Two times a day (BID) | ORAL | 0 refills | Status: AC

## 2020-10-14 NOTE — Progress Notes (Signed)
Chief Complaint:   OBESITY Ashlee Ware is here to discuss her progress with her obesity treatment plan along with follow-up of her obesity related diagnoses. Ashlee Ware is on the Category 2 Plan and states she is following her eating plan approximately 60% of the time. Ashlee Ware states she is doing HIT for 30 minutes 2 times per week.  Today's visit was #: 3 Starting weight: 253 lbs Starting date: 09/09/2020 Today's weight: 252 lbs Today's date: 10/08/2020 Total lbs lost to date: 1 Total lbs lost since last in-office visit: 0  Interim History: Ashlee Ware is up 1 lb since her last visit. She states that her insurance would not pay for Saxenda. She states that she cheated.  Subjective:   1. Polyphagia Ashlee Ware was prescribed Saxenda, but her insurance will not pay for it.  2. Elevated cholesterol Ashlee Ware is not on medications.  3. At risk for constipation Ashlee Ware is at increased risk for constipation due to medications. Ashlee Ware denies hard, infrequent stools currently.   Assessment/Plan:   1. Polyphagia Intensive lifestyle modifications are the first line treatment for this issue. We discussed several lifestyle modifications today. Ashlee Ware agreed to start Phentermine 8 mg BID with no refills. She will continue to work on diet, exercise and weight loss efforts. Orders and follow up as documented in patient record.  Counseling Polyphagia is excessive hunger. Causes can include: low blood sugars, hypERthyroidism, PMS, lack of sleep, stress, insulin resistance, diabetes, certain medications, and diets that are deficient in protein and fiber.   - Phentermine HCl (LOMAIRA) 8 MG TABS; Take 8 mg by mouth 2 (two) times daily.  Dispense: 60 tablet; Refill: 0  2. Elevated cholesterol Cardiovascular risk and specific lipid/LDL goals reviewed. We discussed several lifestyle modifications today and Ashlee Ware will continue to work on diet, exercise and weight loss efforts. She will continue to decrease  saturated fats, and increase healthy fats. Orders and follow up as documented in patient record.   Counseling Intensive lifestyle modifications are the first line treatment for this issue. Dietary changes: Increase soluble fiber. Decrease simple carbohydrates. Exercise changes: Moderate to vigorous-intensity aerobic activity 150 minutes per week if tolerated. Lipid-lowering medications: see documented in medical record.  3. At risk for constipation Ashlee Ware was given approximately 15 minutes of counseling today regarding prevention of constipation. She was encouraged to increase water and fiber intake.   4. Obesity with current BMI of 38.3 Ashlee Ware is currently in the action stage of change. As such, her goal is to continue with weight loss efforts. She has agreed to the Category 2 Plan.   Intentional eating was discussed.  Exercise goals: As is.   Behavioral modification strategies: increasing lean protein intake, decreasing simple carbohydrates, increasing vegetables, increasing water intake, decreasing eating out, no skipping meals, meal planning and cooking strategies, keeping healthy foods in the home, ways to avoid boredom eating, ways to avoid night time snacking, better snacking choices, and emotional eating strategies.  Ashlee Ware has agreed to follow-up with our clinic in 2 to 3 weeks. She was informed of the importance of frequent follow-up visits to maximize her success with intensive lifestyle modifications for her multiple health conditions.   Objective:   Blood pressure 104/71, pulse 60, temperature 98.2 F (36.8 C), height 5\' 8"  (1.727 m), weight 252 lb (114.3 kg), SpO2 100 %. Body mass index is 38.32 kg/m.  General: Cooperative, alert, well developed, in no acute distress. HEENT: Conjunctivae and lids unremarkable. Cardiovascular: Regular rhythm.  Lungs: Normal work of breathing. Neurologic:  No focal deficits.   Lab Results  Component Value Date   CREATININE 0.80  09/09/2020   BUN 14 09/09/2020   NA 139 09/09/2020   K 4.6 09/09/2020   CL 101 09/09/2020   CO2 24 09/09/2020   Lab Results  Component Value Date   ALT 12 09/09/2020   AST 12 09/09/2020   ALKPHOS 60 09/09/2020   BILITOT 0.4 09/09/2020   Lab Results  Component Value Date   HGBA1C 5.5 09/09/2020   Lab Results  Component Value Date   INSULIN 7.7 09/09/2020   Lab Results  Component Value Date   TSH 1.560 09/09/2020   Lab Results  Component Value Date   CHOL 201 (H) 09/09/2020   HDL 44 09/09/2020   LDLCALC 133 (H) 09/09/2020   TRIG 131 09/09/2020   Lab Results  Component Value Date   WBC 6.6 09/09/2020   HGB 13.6 09/09/2020   HCT 41.4 09/09/2020   MCV 91 09/09/2020   PLT 231 09/09/2020   No results found for: IRON, TIBC, FERRITIN  Attestation Statements:   Reviewed by clinician on day of visit: allergies, medications, problem list, medical history, surgical history, family history, social history, and previous encounter notes.   Trude Mcburney, am acting as Energy manager for [employer], DO.  I have reviewed the above documentation for accuracy and completeness, and I agree with the above. Corinna Capra, DO

## 2020-10-16 ENCOUNTER — Encounter (INDEPENDENT_AMBULATORY_CARE_PROVIDER_SITE_OTHER): Payer: Self-pay | Admitting: Bariatrics

## 2020-11-18 ENCOUNTER — Ambulatory Visit (INDEPENDENT_AMBULATORY_CARE_PROVIDER_SITE_OTHER): Payer: BC Managed Care – PPO | Admitting: Bariatrics

## 2020-11-24 DIAGNOSIS — R079 Chest pain, unspecified: Secondary | ICD-10-CM | POA: Diagnosis not present

## 2020-11-24 DIAGNOSIS — R197 Diarrhea, unspecified: Secondary | ICD-10-CM | POA: Diagnosis not present

## 2020-11-24 DIAGNOSIS — Z8249 Family history of ischemic heart disease and other diseases of the circulatory system: Secondary | ICD-10-CM | POA: Diagnosis not present

## 2020-11-25 ENCOUNTER — Ambulatory Visit: Payer: BC Managed Care – PPO | Admitting: Allergy & Immunology

## 2020-12-25 DIAGNOSIS — B354 Tinea corporis: Secondary | ICD-10-CM | POA: Diagnosis not present

## 2020-12-25 DIAGNOSIS — B360 Pityriasis versicolor: Secondary | ICD-10-CM | POA: Diagnosis not present

## 2021-02-12 DIAGNOSIS — L309 Dermatitis, unspecified: Secondary | ICD-10-CM | POA: Diagnosis not present

## 2021-02-12 DIAGNOSIS — L308 Other specified dermatitis: Secondary | ICD-10-CM | POA: Diagnosis not present

## 2021-02-12 DIAGNOSIS — Z23 Encounter for immunization: Secondary | ICD-10-CM | POA: Diagnosis not present

## 2021-02-26 DIAGNOSIS — Z4802 Encounter for removal of sutures: Secondary | ICD-10-CM | POA: Diagnosis not present

## 2021-02-26 DIAGNOSIS — L308 Other specified dermatitis: Secondary | ICD-10-CM | POA: Diagnosis not present

## 2021-02-26 DIAGNOSIS — Z23 Encounter for immunization: Secondary | ICD-10-CM | POA: Diagnosis not present

## 2021-02-26 DIAGNOSIS — D1801 Hemangioma of skin and subcutaneous tissue: Secondary | ICD-10-CM | POA: Diagnosis not present

## 2021-03-19 ENCOUNTER — Encounter (INDEPENDENT_AMBULATORY_CARE_PROVIDER_SITE_OTHER): Payer: Self-pay | Admitting: Bariatrics

## 2021-04-01 IMAGING — MG DIGITAL DIAGNOSTIC BILAT W/ TOMO W/ CAD
6 of 10 series · 6 of 30 positions shown · non-contrast
Comparison: None.

CLINICAL DATA: 39-year-old presenting with a possible palpable lump
in the UPPER RIGHT breast on recent clinical evaluation. This is the
history of breast cancer.

EXAM:
DIGITAL DIAGNOSTIC BILATERAL MAMMOGRAM WITH CAD AND TOMO
ULTRASOUND RIGHT BREAST

[L MLO synth-2D]
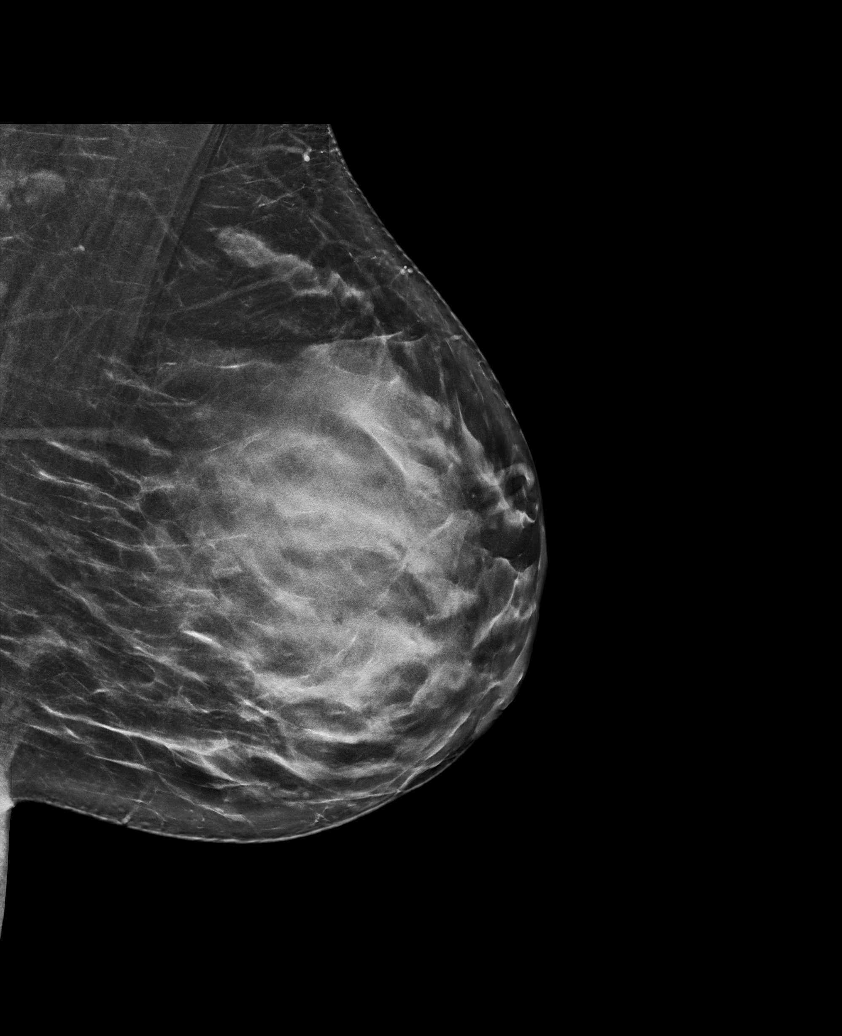

[R CC synth-2D]
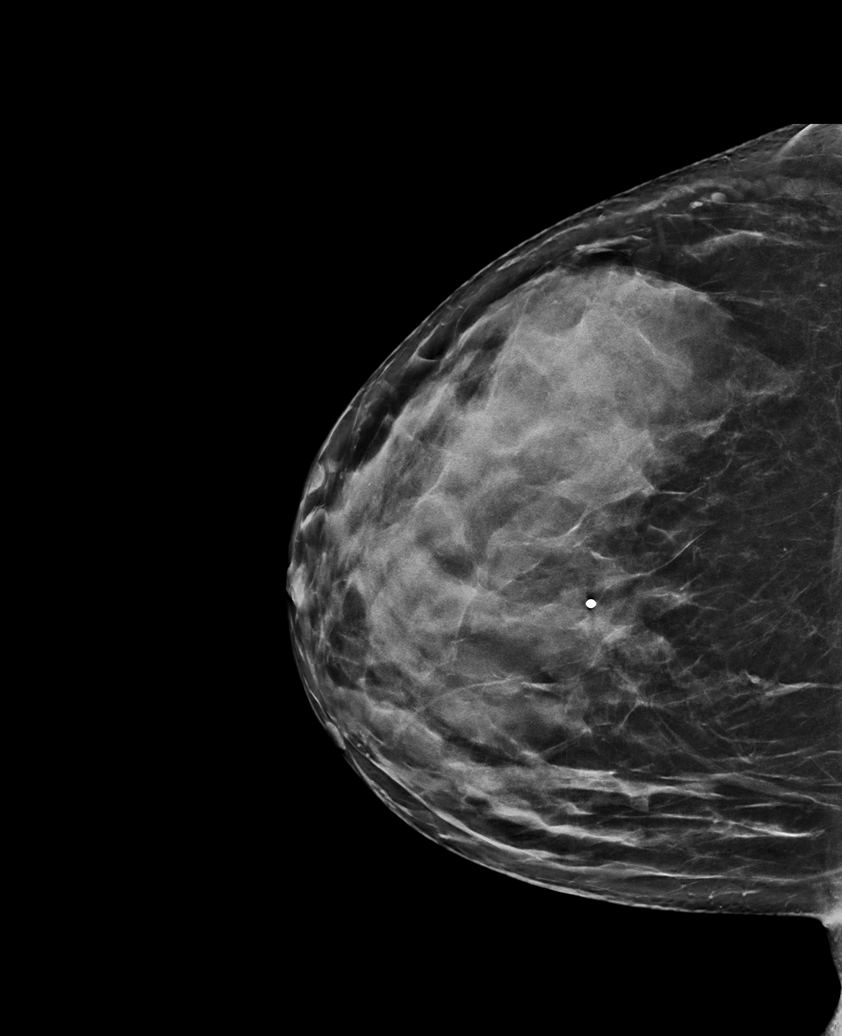

[R MLO synth-2D (1 of 2)]
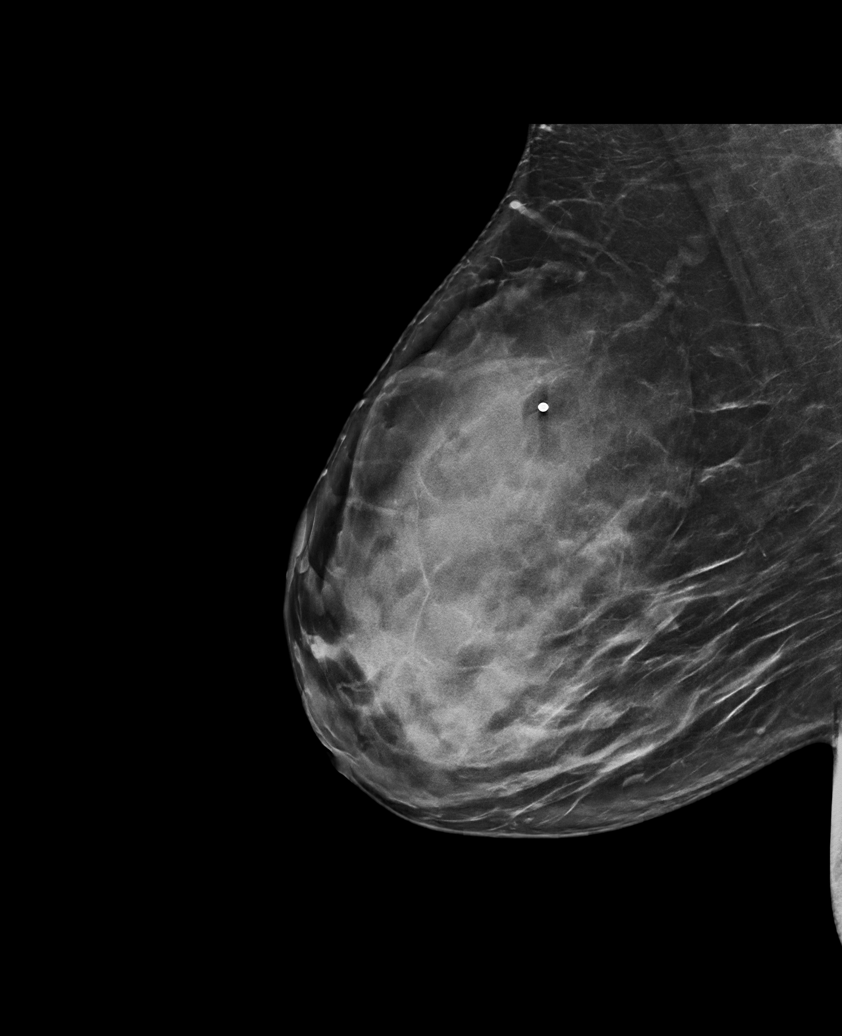

[L CC synth-2D]
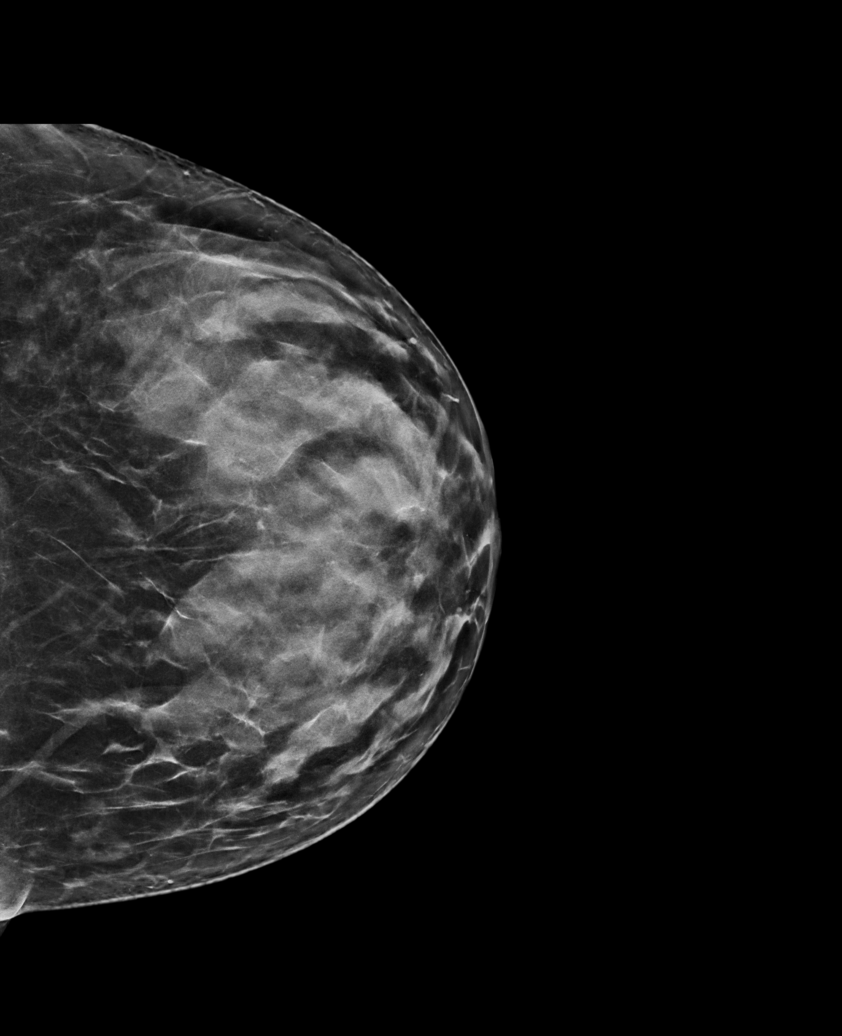

[R MLO synth-2D (2 of 2)]
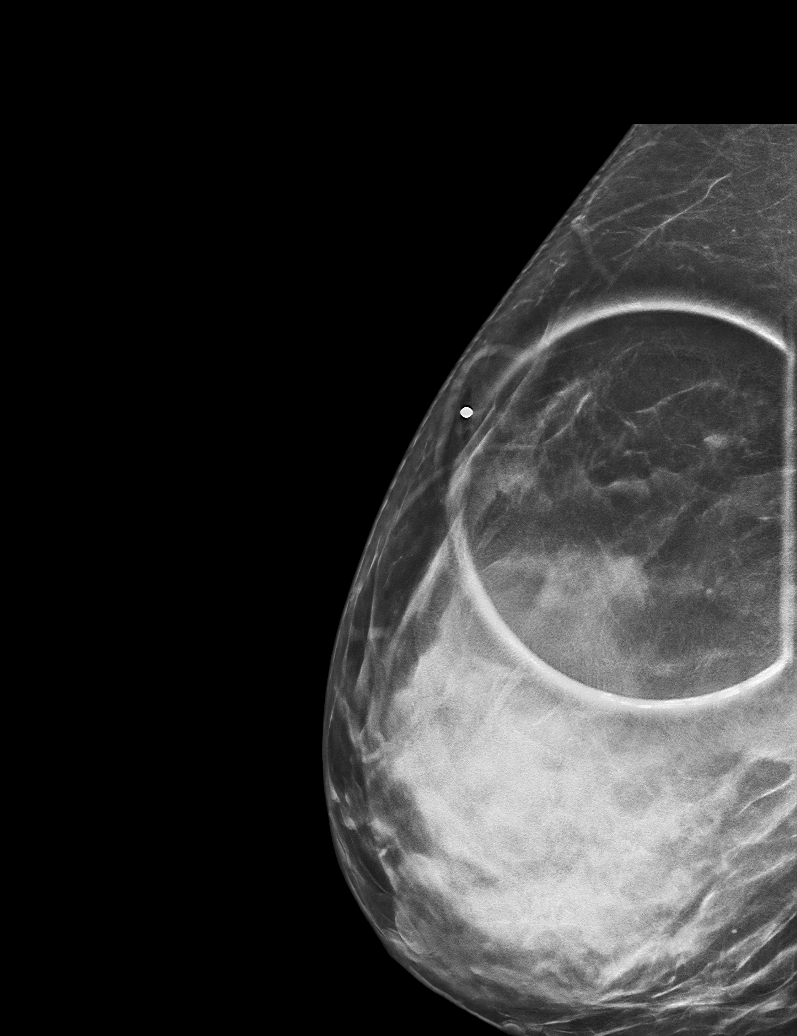

[R MLO tomo · tomo slice 35/70.0]
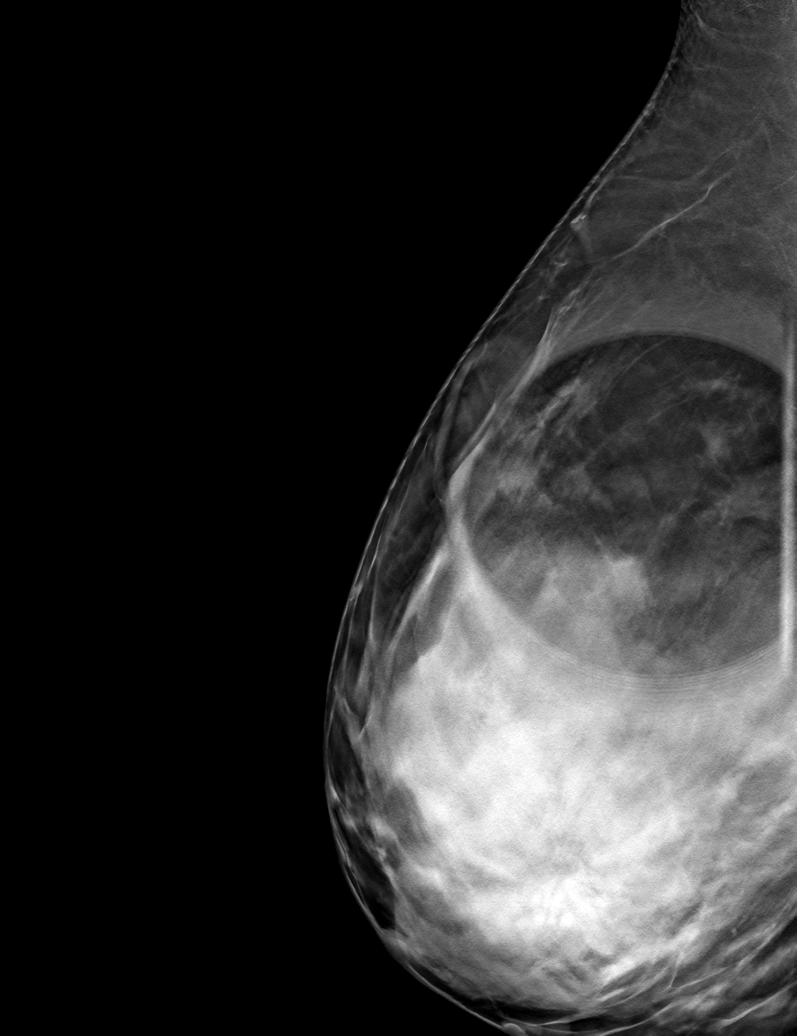

[6 of 30 positions shown; findings below may reference images not displayed]

ACR Breast Density Category d: The breast tissue is extremely dense,
which lowers the sensitivity of mammography.
FINDINGS: Tomosynthesis and synthesized full field CC and MLO views of both
breasts were obtained. Tomosynthesis and synthesized spot
compression tangential view of the area of concern in the RIGHT
breast was also obtained.

No mammographic abnormality in the area of palpable concern in the
UPPER RIGHT breast. Dense fibroglandular tissue is present in this
location.

No findings suspicious for malignancy in either breast.

Mammographic images were processed with CAD.

Targeted RIGHT breast ultrasound is performed, showing an oval
circumscribed parallel heterogeneous though predominantly hypoechoic
mass at the 11 o'clock position approximately 5 cm from nipple
within the dense fibroglandular tissue, measuring approximately
x 0.7 x 1.0 cm, demonstrating peripheral internal power Doppler flow
and demonstrating no posterior characteristics.
IMPRESSION: 1. Likely benign approximate 1 cm fibroadenoma involving the UPPER
OUTER QUADRANT of the RIGHT breast at the 11 o'clock position
approximately 5 cm from the nipple in the area of palpable concern.
2. No mammographic evidence of malignancy involving the LEFT breast.

RECOMMENDATION:
We discussed management options for the likely benign fibroadenoma
including excision, ultrasound-guided core biopsy, and short term
interval follow-up. Follow-up ultrasound is recommended at 6, 12 and
24 months to assess stability. The patient agrees with this plan.

I have discussed the findings and recommendations with the patient.
If applicable, a reminder letter will be sent to the patient
regarding the next appointment.

BI-RADS CATEGORY  3: Probably benign.

## 2021-11-25 ENCOUNTER — Encounter (INDEPENDENT_AMBULATORY_CARE_PROVIDER_SITE_OTHER): Payer: Self-pay
